# Patient Record
Sex: Female | Born: 1965 | Race: Asian | Hispanic: No | Marital: Married | Smoking: Never smoker
Health system: Southern US, Community
[De-identification: ages and names within clinical notes are randomized; demographics above are authoritative.]

## PROBLEM LIST (undated history)

## (undated) DIAGNOSIS — E079 Disorder of thyroid, unspecified: Secondary | ICD-10-CM

## (undated) HISTORY — DX: Disorder of thyroid, unspecified: E07.9

## (undated) HISTORY — PX: WISDOM TOOTH EXTRACTION: SHX21

---

## 2003-05-31 ENCOUNTER — Other Ambulatory Visit: Admission: RE | Admit: 2003-05-31 | Discharge: 2003-05-31 | Payer: Self-pay | Admitting: Obstetrics and Gynecology

## 2004-06-13 ENCOUNTER — Other Ambulatory Visit: Admission: RE | Admit: 2004-06-13 | Discharge: 2004-06-13 | Payer: Self-pay | Admitting: Obstetrics and Gynecology

## 2005-10-15 ENCOUNTER — Encounter: Admission: RE | Admit: 2005-10-15 | Discharge: 2005-10-15 | Payer: Self-pay | Admitting: Gastroenterology

## 2006-02-20 ENCOUNTER — Other Ambulatory Visit: Admission: RE | Admit: 2006-02-20 | Discharge: 2006-02-20 | Payer: Self-pay | Admitting: Obstetrics and Gynecology

## 2007-03-05 ENCOUNTER — Other Ambulatory Visit: Admission: RE | Admit: 2007-03-05 | Discharge: 2007-03-05 | Payer: Self-pay | Admitting: Obstetrics and Gynecology

## 2008-03-11 IMAGING — NM NM HEPATO W/GB/PHARM/[PERSON_NAME]
9 series · 14 of 14 positions shown · non-contrast
Comparison: Ultrasound of earlier today.

CLINICAL DATA: Epigastric pain.  
 HEPATOBILIARY SCAN WITH GALLBLADDER EJECTION FRACTION:
TECHNIQUE: Sequential abdominal images were obtained following intravenous injection of radiopharmaceutical.  Sequential images were continued following oral ingestion of 8 oz. Half & Half Creamer, and the gallbladder ejection fraction was calculated.
 Radiopharmaceutical:  5 mCi 4c-UUm Choletec

[gb hepatobiliary · 1 of 1 slices shown (1 of 9)]
[im 1/1]
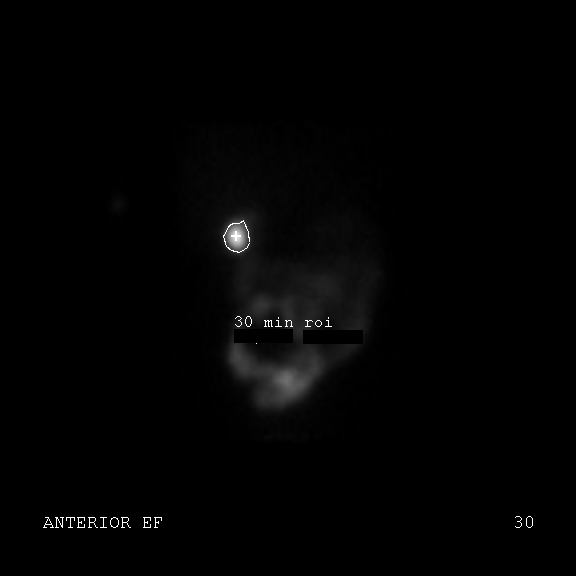

[gb hepatobiliary · 1 of 1 slices shown (2 of 9)]
[im 1/1]
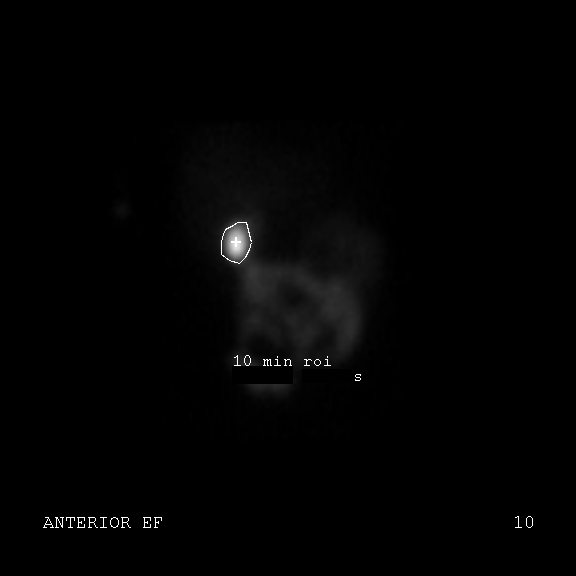

[gb hepatobiliary · 4.66mm/px · 6 of 12 frames shown (3 of 9)]
[frame 2/12]
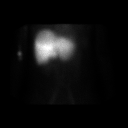
[frame 4/12]
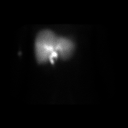
[frame 6/12]
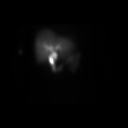
[frame 8/12]
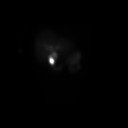
[frame 10/12]
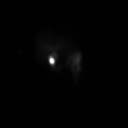
[frame 12/12]
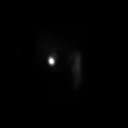

[gb hepatobiliary · 1 of 1 slices shown (4 of 9)]
[im 1/1]
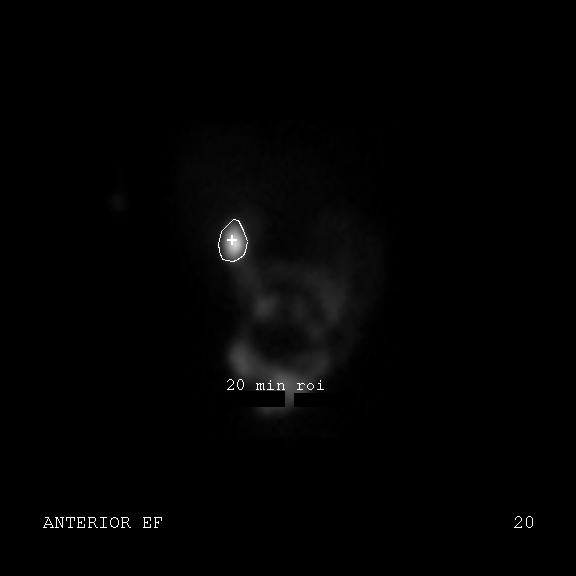

[gb hepatobiliary · 1 of 1 slices shown (5 of 9)]
[im 1/1]
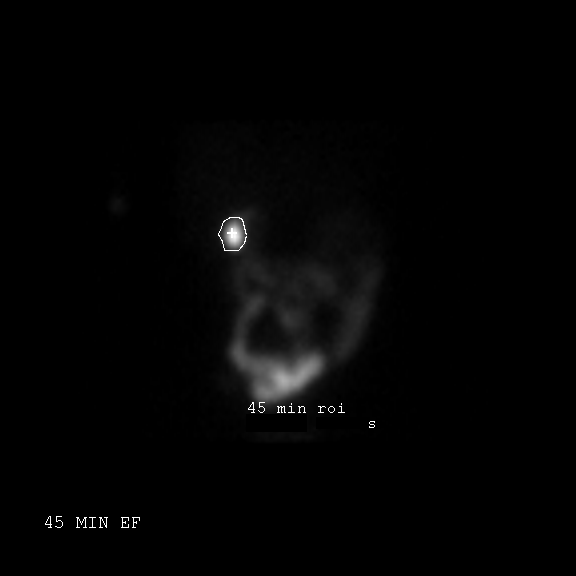

[gb hepatobiliary · 1 of 1 slices shown (6 of 9)]
[im 1/1]
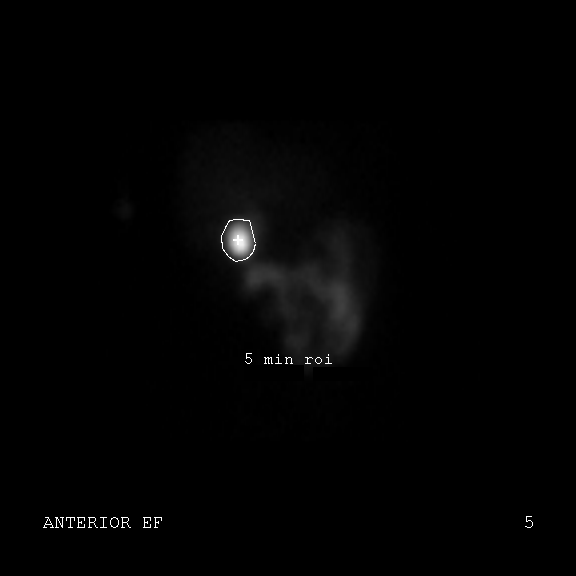

[gb hepatobiliary · 1 of 1 slices shown (7 of 9)]
[im 1/1]
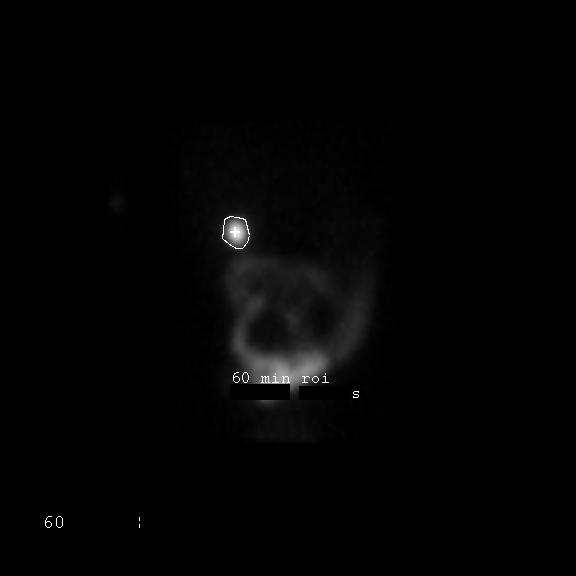

[gb hepatobiliary · 1 of 1 slices shown (8 of 9)]
[im 1/1]
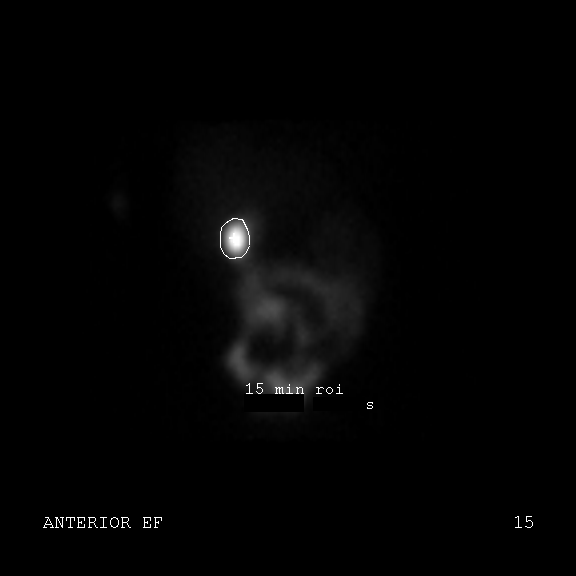

[gb hepatobiliary · 1 of 1 slices shown (9 of 9)]
[im 1/1]
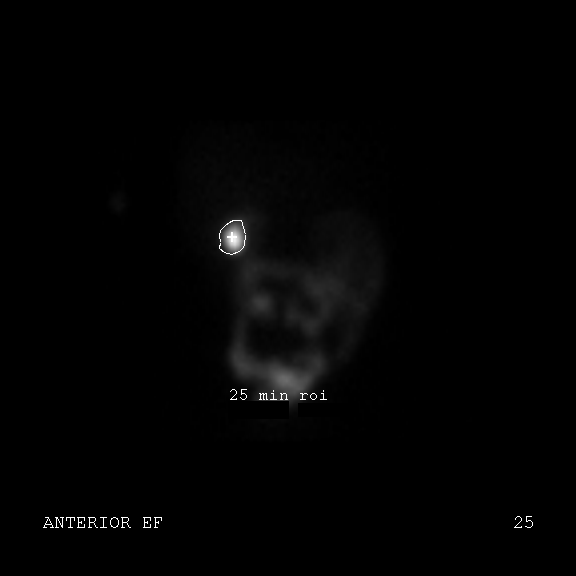

[14 of 14 positions shown; findings below may reference images not displayed]

FINDINGS: Initial images demonstrate prompt uptake of tracer by the liver.  There is common duct activity by approximately 15 minutes and gallbladder activity by 30 minutes.  Proximal bowel activity is seen by 25 minutes.  After administration of Half & Half, gallbladder ejection fraction was calculated at 60% (normal greater than 35).
IMPRESSION: 1.  No evidence of acute cholecystitis or cystic duct obstruction.
 2.  Normal gallbladder ejection fraction of 60%.

## 2008-05-01 ENCOUNTER — Other Ambulatory Visit: Admission: RE | Admit: 2008-05-01 | Discharge: 2008-05-01 | Payer: Self-pay | Admitting: Obstetrics and Gynecology

## 2009-03-15 ENCOUNTER — Encounter: Admission: RE | Admit: 2009-03-15 | Discharge: 2009-03-15 | Payer: Self-pay | Admitting: Obstetrics and Gynecology

## 2010-03-26 ENCOUNTER — Other Ambulatory Visit: Payer: Self-pay | Admitting: Obstetrics and Gynecology

## 2010-03-26 DIAGNOSIS — Z1231 Encounter for screening mammogram for malignant neoplasm of breast: Secondary | ICD-10-CM

## 2010-04-09 ENCOUNTER — Ambulatory Visit
Admission: RE | Admit: 2010-04-09 | Discharge: 2010-04-09 | Disposition: A | Discharge status: Home / Self Care | Payer: BC Managed Care – PPO | Source: Ambulatory Visit | Attending: Obstetrics and Gynecology | Admitting: Obstetrics and Gynecology

## 2010-04-09 DIAGNOSIS — Z1231 Encounter for screening mammogram for malignant neoplasm of breast: Secondary | ICD-10-CM

## 2012-11-10 ENCOUNTER — Ambulatory Visit: Payer: Self-pay | Admitting: Obstetrics and Gynecology

## 2012-11-10 ENCOUNTER — Encounter: Payer: Self-pay | Admitting: Obstetrics and Gynecology

## 2012-11-10 ENCOUNTER — Ambulatory Visit (INDEPENDENT_AMBULATORY_CARE_PROVIDER_SITE_OTHER): Payer: BC Managed Care – PPO | Admitting: Obstetrics and Gynecology

## 2012-11-10 VITALS — BP 110/60 | HR 72 | Resp 16 | Ht 65.75 in | Wt 140.0 lb

## 2012-11-10 DIAGNOSIS — D229 Melanocytic nevi, unspecified: Secondary | ICD-10-CM

## 2012-11-10 DIAGNOSIS — D239 Other benign neoplasm of skin, unspecified: Secondary | ICD-10-CM

## 2012-11-10 DIAGNOSIS — E039 Hypothyroidism, unspecified: Secondary | ICD-10-CM

## 2012-11-10 DIAGNOSIS — Z0000 Encounter for general adult medical examination without abnormal findings: Secondary | ICD-10-CM

## 2012-11-10 DIAGNOSIS — Z01419 Encounter for gynecological examination (general) (routine) without abnormal findings: Secondary | ICD-10-CM

## 2012-11-10 LAB — POCT URINALYSIS DIPSTICK
Glucose, UA: NEGATIVE
Leukocytes, UA: NEGATIVE
Protein, UA: 5

## 2012-11-10 LAB — THYROID PANEL WITH TSH
Free Thyroxine Index: 3 (ref 1.0–3.9)
TSH: 3.339 u[IU]/mL (ref 0.350–4.500)

## 2012-11-10 LAB — HEMOGLOBIN, FINGERSTICK: Hemoglobin, fingerstick: 14.9 g/dL (ref 12.0–16.0)

## 2012-11-10 MED ORDER — LEVOTHYROXINE SODIUM 50 MCG PO TABS: 50.0000 ug | ORAL_TABLET | Freq: Every day | ORAL | Status: DC

## 2012-11-10 NOTE — Progress Notes (Signed)
GYNECOLOGY VISIT  PCP:   Referring provider:   HPI: 47 y.o.   Married  [place]  female   [phone] with Patient's last menstrual period was 10/26/2012.   here for her yearly annual exam and thyroid check. Notes fatigue with menses.  Normal menses every month.  Some hot flashes.  Declines birth control.  Uses withdrawal method.   Hgb:  14.9 Urine:  neg  GYNECOLOGIC HISTORY: Patient's last menstrual period was 10/26/2012. Sexually active:  yes Partner preference: female Contraception: none   Menopausal hormone therapy: none DES exposure: no   Blood transfusions:  no  Sexually transmitted diseases:  never  GYN Procedures:  none Mammogram:   04/09/10 neg              Pap: 10/16/09 neg   History of abnormal pap smear:  no   OB History   Grav Para Term Preterm Abortions TAB SAB Ect Mult Living   2 2 2       2        LIFESTYLE: Exercise:   walking      Tobacco:    no Alcohol:  no Drug use:    OTHER HEALTH MAINTENANCE: Tetanus/TDap:   Gardisil:  NA Influenza:   Zostavax:  NA  Bone density:  NA Colonoscopy:  NA  Cholesterol check:   2012  Family History  Problem Relation Age of Onset  . Osteoporosis Mother   . Hypertension Mother   . Cancer Father     lymphoma    There are no active problems to display for this patient.  Past Medical History  Diagnosis Date  . Thyroid disease     hypothyroid    Past Surgical History  Procedure Laterality Date  . Wisdom tooth extraction      ALLERGIES: Review of patient's allergies indicates no known allergies.  Current Outpatient Prescriptions  Medication Sig Dispense Refill  . levothyroxine (SYNTHROID, LEVOTHROID) 50 MCG tablet Take 50 mcg by mouth daily before breakfast.       No current facility-administered medications for this visit.     ROS:  Pertinent items are noted in HPI.  SOCIAL HISTORY:    PHYSICAL EXAMINATION:    BP 110/60  Pulse 72  Resp 16  Ht 5' 5.75" (1.67 m)  Wt 140 lb (63.504 kg)  BMI  22.77 kg/m2  LMP 10/26/2012   Wt Readings from Last 3 Encounters:  11/10/12 140 lb (63.504 kg)     Ht Readings from Last 3 Encounters:  11/10/12 5' 5.75" (1.67 m)    General appearance: alert, cooperative and appears stated age Head: Normocephalic, without obvious abnormality, atraumatic Neck: no adenopathy, supple, symmetrical, trachea midline and thyroid not enlarged, symmetric, no tenderness/mass/nodules Lungs: clear to auscultation bilaterally Breasts: Inspection negative, No nipple retraction or dimpling, No nipple discharge or bleeding, No axillary or supraclavicular adenopathy, Normal to palpation without dominant masses Heart: regular rate and rhythm Abdomen: soft, non-tender; no masses,  no organomegaly Extremities: extremities normal, atraumatic, no cyanosis or edema Skin: Skin color, texture, turgor normal. No rashes or lesions Lymph nodes: Cervical, supraclavicular, and axillary nodes normal. No abnormal inguinal nodes palpated Neurologic: Grossly normal  Pelvic: External genitalia:  Right mons pubis with 4 mm dark brown/black flat nevus.              Urethra:  normal appearing urethra with no masses, tenderness or lesions              Bartholins and Skenes: normal  Vagina: normal appearing vagina with normal color and discharge, no lesions.  Palpable submucosal cystic regions at vaginal cuff at 10 o'clock.  No visible areas noted.                Cervix: normal appearance.  Large fleshy ectropion noted.              Pap and high risk HPV testing done: yes.            Bimanual Exam:  Uterus:  uterus is normal size, shape, consistency and nontender                                      Adnexa: normal adnexa in size, nontender and no masses                                      Rectovaginal: Confirms                                      Anus:  normal sphincter tone, no lesions  ASSESSMENT  Dark vulvar nevus.  Vaginal cuff  cysts? Hypothyroidism.  PLAN  Mammogram at [facility] scheduled by our staff for patient. Pap smear and high risk HPV testing Check thyroid function. Synthroid refilled.  See EPIC orders. Return for vulvar biopsy and will relook at the vaginal cuff with the colposcope at that time.  Return annually or prn   An After Visit Summary was printed and given to the patient.

## 2012-11-10 NOTE — Patient Instructions (Signed)

## 2012-11-12 LAB — IPS PAP TEST WITH HPV

## 2012-11-15 ENCOUNTER — Encounter: Payer: Self-pay | Admitting: Obstetrics and Gynecology

## 2012-11-15 MED ORDER — LEVOTHYROXINE SODIUM 50 MCG PO TABS: 50.0000 ug | ORAL_TABLET | Freq: Every day | ORAL | Status: DC

## 2012-12-09 ENCOUNTER — Other Ambulatory Visit: Payer: Self-pay

## 2012-12-13 ENCOUNTER — Ambulatory Visit
Admission: RE | Admit: 2012-12-13 | Discharge: 2012-12-13 | Disposition: A | Discharge status: Home / Self Care | Payer: BC Managed Care – PPO | Source: Ambulatory Visit | Attending: Obstetrics and Gynecology | Admitting: Obstetrics and Gynecology

## 2012-12-13 DIAGNOSIS — Z01419 Encounter for gynecological examination (general) (routine) without abnormal findings: Secondary | ICD-10-CM

## 2013-09-29 ENCOUNTER — Encounter: Payer: Self-pay | Admitting: Obstetrics and Gynecology

## 2013-11-11 ENCOUNTER — Ambulatory Visit: Payer: BC Managed Care – PPO | Admitting: Obstetrics and Gynecology

## 2013-11-18 ENCOUNTER — Encounter: Payer: Self-pay | Admitting: Obstetrics and Gynecology

## 2013-11-18 ENCOUNTER — Ambulatory Visit (INDEPENDENT_AMBULATORY_CARE_PROVIDER_SITE_OTHER): Payer: 59 | Admitting: Obstetrics and Gynecology

## 2013-11-18 VITALS — BP 120/70 | HR 72 | Resp 18 | Ht 66.26 in | Wt 145.0 lb

## 2013-11-18 DIAGNOSIS — E039 Hypothyroidism, unspecified: Secondary | ICD-10-CM

## 2013-11-18 DIAGNOSIS — R1909 Other intra-abdominal and pelvic swelling, mass and lump: Secondary | ICD-10-CM

## 2013-11-18 DIAGNOSIS — Z01419 Encounter for gynecological examination (general) (routine) without abnormal findings: Secondary | ICD-10-CM

## 2013-11-18 DIAGNOSIS — N390 Urinary tract infection, site not specified: Secondary | ICD-10-CM

## 2013-11-18 DIAGNOSIS — Z1211 Encounter for screening for malignant neoplasm of colon: Secondary | ICD-10-CM

## 2013-11-18 DIAGNOSIS — Z0000 Encounter for general adult medical examination without abnormal findings: Secondary | ICD-10-CM

## 2013-11-18 DIAGNOSIS — N888 Other specified noninflammatory disorders of cervix uteri: Secondary | ICD-10-CM | POA: Insufficient documentation

## 2013-11-18 DIAGNOSIS — R8281 Pyuria: Secondary | ICD-10-CM

## 2013-11-18 LAB — THYROID PANEL WITH TSH
Free Thyroxine Index: 2.2 (ref 1.4–3.8)
T3 UPTAKE: 26 % (ref 22.0–35.0)
T4 TOTAL: 8.6 ug/dL (ref 4.5–12.0)
TSH: 3.947 u[IU]/mL (ref 0.350–4.500)

## 2013-11-18 LAB — POCT URINALYSIS DIPSTICK
Bilirubin, UA: NEGATIVE
Glucose, UA: NEGATIVE
KETONES UA: NEGATIVE
NITRITE UA: NEGATIVE
PROTEIN UA: NEGATIVE
UROBILINOGEN UA: NEGATIVE
pH, UA: 7

## 2013-11-18 LAB — HEMOGLOBIN, FINGERSTICK: Hemoglobin, fingerstick: 14.6 g/dL (ref 12.0–16.0)

## 2013-11-18 MED ORDER — LEVOTHYROXINE SODIUM 50 MCG PO TABS: 50.0000 ug | ORAL_TABLET | Freq: Every day | ORAL | Status: AC

## 2013-11-18 NOTE — Patient Instructions (Signed)

## 2013-11-18 NOTE — Progress Notes (Signed)
48 y.o. [id number] MarriedAsianF here for annual exam.   Menses every month.  Some hot flashes.   Uses withdrawal for birth control.   Patient's last menstrual period was 10/30/2013.          Sexually active: Yes.    The current method of family planning is none.    Exercising: Yes.    Walking  Smoker:  no  Health Maintenance: Pap:  11/2012 Neg. HR HPV: neg History of abnormal Pap:  Unsure MMG:  12/2012 BIRADS1:Neg Colonoscopy:  N/A BMD:   N/A TDaP:  2009 Screening Labs:  TFTs, Hgb 14.6, Urine today: RBC:Mod, WBC:Mod, PH 7.0 - some frequency but no pain.    reports that she has never smoked. She has never used smokeless tobacco. She reports that she does not drink alcohol or use illicit drugs.  Past Medical History  Diagnosis Date  . Thyroid disease     hypothyroid    Past Surgical History  Procedure Laterality Date  . Wisdom tooth extraction      Current Outpatient Prescriptions  Medication Sig Dispense Refill  . levothyroxine (SYNTHROID, LEVOTHROID) 50 MCG tablet Take 1 tablet (50 mcg total) by mouth daily.  90 tablet  3   No current facility-administered medications for this visit.    Family History  Problem Relation Age of Onset  . Osteoporosis Mother   . Hypertension Mother   . Cancer Father     lymphoma    ROS:  Pertinent items are noted in HPI.  Otherwise, a comprehensive ROS was negative.  Exam:   BP 120/70  Pulse 72  Resp 18  Ht 5' 6.26" (1.683 m)  Wt 145 lb (65.772 kg)  BMI 23.22 kg/m2  LMP 10/30/2013  Weight change: @WEIGHTCHANGE @ Height:   Height: 5' 6.26" (168.3 cm)  Ht Readings from Last 3 Encounters:  11/18/13 5' 6.26" (1.683 m)  11/10/12 5' 5.75" (1.67 m)    General appearance: alert, cooperative and appears stated age Head: Normocephalic, without obvious abnormality, atraumatic Neck: no adenopathy, supple, symmetrical, trachea midline and thyroid mildly enlarged. Lungs: clear to auscultation bilaterally Breasts: normal appearance, no  masses or tenderness, Inspection negative, No nipple retraction or dimpling, No nipple discharge or bleeding, No axillary or supraclavicular adenopathy Heart: regular rate and rhythm Abdomen: soft, non-tender; bowel sounds normal; no masses,  no organomegaly Extremities: extremities normal, atraumatic, no cyanosis or edema Skin: Skin color, texture, turgor normal. No rashes or lesions Lymph nodes: Cervical, supraclavicular, and axillary nodes normal. No abnormal inguinal nodes palpated Neurologic: Grossly normal   Pelvic: External genitalia:  Dark nevus of the right Mons.  States no change.              Urethra:  normal appearing urethra with no masses, tenderness or lesions              Bartholins and Skenes: normal                 Vagina: normal appearing vagina with normal color and discharge, no lesions, bilateral nodules - cystic subcutaneous areas of the bilateral.               Cervix:  fungating 2 cm friable mass from the central cervix. Pap and HR HPV taken.  Polyp versus neoplasia?              Pap taken: Yes.   Bimanual Exam:  Uterus:  normal and normal size, contour, position, consistency, mobility, non-tender  Adnexa: no mass, fullness, tenderness               Rectovaginal: Confirms               Anus:  normal sphincter tone, no lesions  A:  Well Woman with normal exam Fungating cervical mass.  Subcutaneous nodules of the vaginal walls. Pyuria. Hematuria. Asymptomatic per patient through interpretor.  Hypothyroid.  P:   Mammogram yearly. pap smear  Performed with a rush to [facility] pathology. Will have patient return next week for a discussion of results and for an ultrasound of the pelvis to rule out endometrial pathology and help to understand the cervical area better.  May need colposcopy and biopsy of cervix and endometrium but will await the ultrasound and the cervical cytology first.  TFTs.  Refill Synthroid.  See orders.  Colonoscopy  recommended. Urine micro and culture.  Language interpretor line used to communicate the cervical findings and the urinalysis to patient as patient initially declined a inerpretor.  Patient understands the importance of returning next week for further care.   An After Visit Summary was printed and given to the patient.

## 2013-11-18 NOTE — Progress Notes (Signed)
Utilized [place] interpretor # [phone], interpretor, Otila Kluver. [place] Language line [phone] access code [phone].

## 2013-11-19 LAB — URINALYSIS, MICROSCOPIC ONLY
Bacteria, UA: NONE SEEN
Casts: NONE SEEN
Crystals: NONE SEEN

## 2013-11-20 LAB — URINE CULTURE
COLONY COUNT: NO GROWTH
ORGANISM ID, BACTERIA: NO GROWTH

## 2013-11-21 ENCOUNTER — Telehealth: Payer: Self-pay

## 2013-11-21 ENCOUNTER — Telehealth: Payer: Self-pay | Admitting: Obstetrics and Gynecology

## 2013-11-21 NOTE — Telephone Encounter (Signed)
Pt spouse called this morning and canceled pts  PUS appts for Thursday said that pt would be busy Thursday and would call back to reschedule. Pt only speaks [place]

## 2013-11-21 NOTE — Telephone Encounter (Signed)
Message copied by Lowella Fairy on Mon Nov 21, 2013  8:56 AM ------      Message from: Currie, BROOK E      Created: Sun Nov 20, 2013  6:55 PM      Regarding: please add HR HPV testing to pap done last week - [facility]       Hello Estill Bamberg,            Please contact [facility] and add HR HPV testing to the pap that I did last week.              I received the cytology, and it was normal.            Thanks,            Brook ------

## 2013-11-21 NOTE — Telephone Encounter (Signed)
Per Dr. Elza Rafter request, called [facility] and spoke with Delsa Bern [phone]) and requested he add HR HPV testing to pap which was collected on 11-18-13.  He will add testing and send [place] results.

## 2013-11-22 NOTE — Telephone Encounter (Signed)
Left message for patient to call back. Need to go over benefits for PUS. Pr $465.96

## 2013-11-23 NOTE — Telephone Encounter (Signed)
Please contact patient back through interpretor.  I really need to re-evaluate her in the office.  She has a large polypoid growth on her cervix and vaginal changes that I need to understand better.  The ultrasound will tell me if she has a growth coming from inside the uterine cavity.  I also need to physically look at her cervix again.  I am happy that the pap was normal, but this does not yet give me a diagnosis.   I am happy to have the patient come back with her husband and I can then also see her cervix and we can all talk together.   Thanks.

## 2013-11-23 NOTE — Telephone Encounter (Signed)
Routing update to Dr. Quincy Simmonds.  Patient spouse called and cancelled pelvic ultrasound.  Patient designated party release form on file.  Contact patient with interpretor only?

## 2013-11-23 NOTE — Telephone Encounter (Signed)
Message left to return call to Twylia Oka at [phone].    

## 2013-11-24 ENCOUNTER — Other Ambulatory Visit: Payer: 59 | Admitting: Obstetrics and Gynecology

## 2013-11-24 ENCOUNTER — Other Ambulatory Visit: Payer: 59

## 2013-12-02 NOTE — Telephone Encounter (Signed)
OKay to close? °

## 2013-12-05 ENCOUNTER — Encounter: Payer: Self-pay | Admitting: Obstetrics and Gynecology

## 2013-12-05 NOTE — Telephone Encounter (Signed)
Message left to return call to [place] at [phone] on both home and mobile phone.

## 2013-12-07 NOTE — Telephone Encounter (Signed)
Called patient on Mobile.  [place] interpretor # [phone] [place] Language line [phone] access code [phone].  Spoke with patient and message from Dr. Quincy Simmonds given using interpretor.  Patient agreeable. Requests date of 12/22/13 for ultrasound with Dr. Quincy Simmonds.  Scheduled appointment.  Patient advised to call back with any concerns and verbalizes understanding of message from Dr. Quincy Simmonds.  Routing to provider for final review. Patient agreeable to disposition. Will close encounter

## 2013-12-08 ENCOUNTER — Encounter: Payer: Self-pay | Admitting: Emergency Medicine

## 2013-12-08 ENCOUNTER — Encounter: Payer: Self-pay | Admitting: Obstetrics and Gynecology

## 2013-12-09 NOTE — Telephone Encounter (Signed)
Ultrasound cancelled per MyChart request. Dr. Quincy Simmonds is aware that patient will call back to reschedule at a later date.

## 2013-12-22 ENCOUNTER — Other Ambulatory Visit: Payer: 59

## 2013-12-22 ENCOUNTER — Other Ambulatory Visit: Payer: 59 | Admitting: Obstetrics and Gynecology

## 2014-01-12 ENCOUNTER — Encounter: Payer: Self-pay | Admitting: *Deleted
